# Patient Record
Sex: Female | Born: 2000 | Race: White | Hispanic: No | Marital: Single
Health system: Southern US, Community
[De-identification: ages and names within clinical notes are randomized; demographics above are authoritative.]

## PROBLEM LIST (undated history)

## (undated) DIAGNOSIS — F32A Depression, unspecified: Secondary | ICD-10-CM

## (undated) DIAGNOSIS — T7840XA Allergy, unspecified, initial encounter: Secondary | ICD-10-CM

## (undated) DIAGNOSIS — R040 Epistaxis: Principal | ICD-10-CM

## (undated) DIAGNOSIS — F329 Major depressive disorder, single episode, unspecified: Secondary | ICD-10-CM

## (undated) DIAGNOSIS — Z87440 Personal history of urinary (tract) infections: Secondary | ICD-10-CM

## (undated) HISTORY — DX: Depression, unspecified: F32.A

## (undated) HISTORY — DX: Major depressive disorder, single episode, unspecified: F32.9

---
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
## 2021-05-02 ENCOUNTER — Other Ambulatory Visit: Payer: Self-pay

## 2021-05-02 ENCOUNTER — Ambulatory Visit
Admission: RE | Admit: 2021-05-02 | Discharge: 2021-05-02 | Disposition: A | Discharge status: Home / Self Care | Payer: Medicaid Other | Source: Ambulatory Visit | Attending: Pediatrics | Admitting: Pediatrics

## 2021-05-02 DIAGNOSIS — R079 Chest pain, unspecified: Secondary | ICD-10-CM | POA: Diagnosis not present

## 2021-05-02 DIAGNOSIS — R002 Palpitations: Secondary | ICD-10-CM | POA: Diagnosis present

## 2021-12-16 ENCOUNTER — Other Ambulatory Visit: Payer: Self-pay

## 2021-12-16 ENCOUNTER — Encounter: Payer: Self-pay | Admitting: Podiatry

## 2021-12-16 ENCOUNTER — Ambulatory Visit (INDEPENDENT_AMBULATORY_CARE_PROVIDER_SITE_OTHER): Payer: Medicaid Other | Admitting: Podiatry

## 2021-12-16 DIAGNOSIS — L600 Ingrowing nail: Secondary | ICD-10-CM | POA: Diagnosis not present

## 2021-12-16 NOTE — Progress Notes (Signed)
Subjective:  Patient ID: Angela Mcmahon, female    DOB: 02/01/2001,  MRN: [id number]  Chief Complaint  Patient presents with   Ingrown Toenail    21 y.o. female presents with the above complaint.  Patient presents with right lateral border ingrown of the hallux.  Patient is a painful to touch painful to walk on.  She would like to have removed.  She has not seen anyone as prior to seeing me.  She denies any other acute complaints.  She has not seen anyone as prior to seeing me.  She would like to have removed.  Pain scale is 8 out of 10.  There is no infection present.   Review of Systems: Negative except as noted in the HPI. Denies N/V/F/Ch.  Past Medical History:  Diagnosis Date   Allergy    Depression    Epistaxis 05/2012   bilat. ant. epistaxis   Hx: UTI (urinary tract infection) 05/2012   finished antibiotic within the last week    Current Outpatient Medications:    cetirizine (ZYRTEC) 1 MG/ML syrup, Take 7.5 mg by mouth daily. PM, Disp: , Rfl:    Cholecalciferol (VITAMIN D) 2000 units tablet, Take 2,000 Units by mouth daily., Disp: , Rfl: 0   Dapsone 5 % topical gel, APPLY TO AFFECTED AREA EVERY DAY, Disp: , Rfl: 2   ibuprofen (ADVIL,MOTRIN) 800 MG tablet, Take 1 tablet (800 mg total) by mouth every 8 (eight) hours as needed., Disp: 60 tablet, Rfl: 1   JUNEL 1/20 1-20 MG-MCG tablet, Take 1 tablet by mouth daily., Disp: , Rfl: 3  Social History   Tobacco Use  Smoking Status Passive Smoke Exposure - Never Smoker  Smokeless Tobacco Never  Tobacco Comments   outside smokers at home    No Known Allergies Objective:  There were no vitals filed for this visit. There is no height or weight on file to calculate BMI. Constitutional Well developed. Well nourished.  Vascular Dorsalis pedis pulses palpable bilaterally. Posterior tibial pulses palpable bilaterally. Capillary refill normal to all digits.  No cyanosis or clubbing noted. Pedal hair growth normal.  Neurologic  Normal speech. Oriented to person, place, and time. Epicritic sensation to light touch grossly present bilaterally.  Dermatologic Painful ingrowing nail at lateral nail borders of the hallux nail right. No other open wounds. No skin lesions.  Orthopedic: Normal joint ROM without pain or crepitus bilaterally. No visible deformities. No bony tenderness.   Radiographs: None Assessment:   1. Ingrown toenail of right foot    Plan:  Patient was evaluated and treated and all questions answered.  Ingrown Nail, right -Patient elects to proceed with minor surgery to remove ingrown toenail removal today. Consent reviewed and signed by patient. -Ingrown nail excised. See procedure note. -Educated on post-procedure care including soaking. Written instructions provided and reviewed. -Patient to follow up in 2 weeks for nail check.  Procedure: Excision of Ingrown Toenail Location: Right 1st toe lateral nail borders. Anesthesia: Lidocaine 1% plain; 1.5 mL and Marcaine 0.5% plain; 1.5 mL, digital block. Skin Prep: Betadine. Dressing: Silvadene; telfa; dry, sterile, compression dressing. Technique: Following skin prep, the toe was exsanguinated and a tourniquet was secured at the base of the toe. The affected nail border was freed, split with a nail splitter, and excised. Chemical matrixectomy was then performed with phenol and irrigated out with alcohol. The tourniquet was then removed and sterile dressing applied. Disposition: Patient tolerated procedure well. Patient to return in 2 weeks for follow-up.  No follow-ups on file.

## 2021-12-25 ENCOUNTER — Encounter: Payer: Self-pay | Admitting: Podiatry

## 2023-02-01 DIAGNOSIS — R2232 Localized swelling, mass and lump, left upper limb: Secondary | ICD-10-CM

## 2023-02-01 DIAGNOSIS — M79642 Pain in left hand: Secondary | ICD-10-CM | POA: Insufficient documentation

## 2023-02-01 HISTORY — DX: Localized swelling, mass and lump, left upper limb: R22.32

## 2023-10-26 ENCOUNTER — Other Ambulatory Visit: Payer: Self-pay

## 2023-10-26 ENCOUNTER — Emergency Department (HOSPITAL_COMMUNITY)
Admission: EM | Admit: 2023-10-26 | Discharge: 2023-10-26 | Disposition: A | Discharge status: Home / Self Care | Payer: No Typology Code available for payment source | Attending: Emergency Medicine | Admitting: Emergency Medicine

## 2023-10-26 ENCOUNTER — Encounter (HOSPITAL_COMMUNITY): Payer: Self-pay

## 2023-10-26 ENCOUNTER — Emergency Department (HOSPITAL_COMMUNITY): Payer: No Typology Code available for payment source

## 2023-10-26 DIAGNOSIS — M542 Cervicalgia: Secondary | ICD-10-CM | POA: Insufficient documentation

## 2023-10-26 DIAGNOSIS — Y92410 Unspecified street and highway as the place of occurrence of the external cause: Secondary | ICD-10-CM | POA: Insufficient documentation

## 2023-10-26 DIAGNOSIS — R519 Headache, unspecified: Secondary | ICD-10-CM | POA: Diagnosis present

## 2023-10-26 DIAGNOSIS — R42 Dizziness and giddiness: Secondary | ICD-10-CM | POA: Diagnosis not present

## 2023-10-26 DIAGNOSIS — R109 Unspecified abdominal pain: Secondary | ICD-10-CM | POA: Insufficient documentation

## 2023-10-26 LAB — CBC
HCT: 41.9 % (ref 36.0–46.0)
Hemoglobin: 13.2 g/dL (ref 12.0–15.0)
MCH: 28.4 pg (ref 26.0–34.0)
MCHC: 31.5 g/dL (ref 30.0–36.0)
MCV: 90.3 fL (ref 80.0–100.0)
Platelets: 195 10*3/uL (ref 150–400)
RBC: 4.64 MIL/uL (ref 3.87–5.11)
RDW: 14.8 % (ref 11.5–15.5)
WBC: 6.3 10*3/uL (ref 4.0–10.5)
nRBC: 0 % (ref 0.0–0.2)

## 2023-10-26 LAB — COMPREHENSIVE METABOLIC PANEL
ALT: 11 U/L (ref 0–44)
AST: 16 U/L (ref 15–41)
Albumin: 4.5 g/dL (ref 3.5–5.0)
Alkaline Phosphatase: 36 U/L — ABNORMAL LOW (ref 38–126)
Anion gap: 8 (ref 5–15)
BUN: 15 mg/dL (ref 6–20)
CO2: 24 mmol/L (ref 22–32)
Calcium: 8.9 mg/dL (ref 8.9–10.3)
Chloride: 103 mmol/L (ref 98–111)
Creatinine, Ser: 0.58 mg/dL (ref 0.44–1.00)
GFR, Estimated: >60 mL/min (ref >60)
Glucose, Bld: 93 mg/dL (ref 70–99)
Potassium: 3.8 mmol/L (ref 3.5–5.1)
Sodium: 135 mmol/L (ref 135–145)
Total Bilirubin: 0.5 mg/dL (ref <1.2)
Total Protein: 8.1 g/dL (ref 6.5–8.1)

## 2023-10-26 LAB — HCG, QUANTITATIVE, PREGNANCY: hCG, Beta Chain, Quant, S: <1 m[IU]/mL (ref <5)

## 2023-10-26 MED ORDER — IOHEXOL 300 MG/ML  SOLN
100.0000 mL | Freq: Once | INTRAMUSCULAR | Status: AC | PRN
  Administered 2023-10-26: 100 mL via INTRAVENOUS

## 2023-10-26 MED ORDER — IBUPROFEN 800 MG PO TABS
800.0000 mg | ORAL_TABLET | Freq: Once | ORAL | Status: AC
  Administered 2023-10-26: 800 mg via ORAL
  Filled 2023-10-26: qty 1

## 2023-10-26 MED ORDER — CYCLOBENZAPRINE HCL 10 MG PO TABS: 10.0000 mg | ORAL_TABLET | Freq: Two times a day (BID) | ORAL | 0 refills | Status: DC | PRN

## 2023-10-26 MED ORDER — ACETAMINOPHEN 325 MG PO TABS
650.0000 mg | ORAL_TABLET | Freq: Once | ORAL | Status: AC
  Administered 2023-10-26: 650 mg via ORAL
  Filled 2023-10-26: qty 2
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
## 2024-03-21 ENCOUNTER — Other Ambulatory Visit (HOSPITAL_COMMUNITY)
Admission: RE | Admit: 2024-03-21 | Discharge: 2024-03-21 | Disposition: A | Discharge status: Home / Self Care | Source: Ambulatory Visit | Attending: Obstetrics & Gynecology | Admitting: Obstetrics & Gynecology

## 2024-03-21 ENCOUNTER — Ambulatory Visit (INDEPENDENT_AMBULATORY_CARE_PROVIDER_SITE_OTHER): Admitting: Obstetrics & Gynecology

## 2024-03-21 VITALS — BP 102/58 | HR 83 | Ht 65.0 in | Wt 145.0 lb

## 2024-03-21 DIAGNOSIS — Z01419 Encounter for gynecological examination (general) (routine) without abnormal findings: Secondary | ICD-10-CM | POA: Insufficient documentation

## 2024-03-21 DIAGNOSIS — Z124 Encounter for screening for malignant neoplasm of cervix: Secondary | ICD-10-CM

## 2024-03-21 DIAGNOSIS — Z113 Encounter for screening for infections with a predominantly sexual mode of transmission: Secondary | ICD-10-CM

## 2024-03-21 DIAGNOSIS — L680 Hirsutism: Secondary | ICD-10-CM

## 2024-03-21 NOTE — Progress Notes (Signed)
 GYNECOLOGY ANNUAL PHYSICAL EXAM PROGRESS NOTE  Subjective:    Angela Mcmahon is a 23 y.o. single monogamous [id number] who presents for an annual exam.  The patient is sexually active. The patient participates in regular exercise: yes. Has the patient ever been transfused or tattooed?: not asked. The patient reports that there is not domestic violence in her life.   The patient has the following complaints today: She started taking OCPs in a 3 month continuous fashion 12/24. Initially this went well until she bled for most of last month. She is currently not bleeding. She used Nexplanon for 3 years with no bleeding but with her second Nexplanon, she bled straight for 3 months and had it removed. That is when she started the Junel 1/20. She has not had a Pap smear yet. She started Gardasil but her mom didn't want her to finish the series, worried about cancers. Her mom had negative gene mutation testing. 4.   She has to shave her abdomen, nipples and occasionally a random hair on her face.  Menstrual History: Menarche age: 38 Patient's last menstrual period was 03/07/2024. Period Pattern: (!) Irregular Menstrual Flow: Heavy, Moderate, Light Menstrual Control: Tampon, Maxi pad Dysmenorrhea: (!) Moderate Dysmenorrhea Symptoms: Cramping       OB History  Gravida Para Term Preterm AB Living  0 0 0 0 0 0  SAB IAB Ectopic Multiple Live Births  0 0 0 0 0    Past Medical History:  Diagnosis Date   Allergy    Depression    Epistaxis 05/2012   bilat. ant. epistaxis   Hx: UTI (urinary tract infection) 05/2012   finished antibiotic within the last week    Past Surgical History:  Procedure Laterality Date   NASAL HEMORRHAGE CONTROL  06/06/2012   Procedure: EPISTAXIS CONTROL;  Surgeon: Lenton Rail, MD;  Location:  SURGERY CENTER;  Service: ENT;  Laterality: Bilateral;    Family History  Problem Relation Age of Onset   Hypertension Mother    Asthma Mother     Transient ischemic attack Mother    Colon polyps Mother    Hypertension Maternal Aunt    Asthma Maternal Uncle    Diabetes Paternal Aunt    Hypertension Maternal Grandmother    Lung cancer Maternal Grandmother    Brain cancer Maternal Grandmother    Diabetes Paternal Grandmother    Hypertension Paternal Grandmother    Nephrolithiasis Maternal Grandfather        hx. acute renal failure due to stones   Colon cancer Maternal Grandfather    Brain cancer Maternal Grandfather    Hypothyroidism Father    Bipolar disorder Sister    Depression Sister    ADD / ADHD Brother    Brain cancer Paternal Grandfather     Social History   Socioeconomic History   Marital status: Single    Spouse name: Not on file   Number of children: Not on file   Years of education: Not on file   Highest education level: Not on file  Occupational History   Not on file  Tobacco Use   Smoking status: Passive Smoke Exposure - Never Smoker   Smokeless tobacco: Never   Tobacco comments:    outside smokers at home  Substance and Sexual Activity   Alcohol use: No   Drug use: No   Sexual activity: Never    Birth control/protection: Pill  Other Topics Concern   Not on file  Social History Narrative  Not on file   Social Drivers of Health   Financial Resource Strain: Not on file  Food Insecurity: Not on file  Transportation Needs: Not on file  Physical Activity: Not on file  Stress: Not on file  Social Connections: Not on file  Intimate Partner Violence: Not on file    Current Outpatient Medications on File Prior to Visit  Medication Sig Dispense Refill   adapalene (DIFFERIN) 0.1 % cream SMARTSIG:sparingly Topical Every Night     cetirizine (ZYRTEC) 1 MG/ML syrup Take 7.5 mg by mouth daily. PM     glycopyrrolate (ROBINUL) 1 MG tablet Take 1 mg by mouth daily.     JUNEL 1/20 1-20 MG-MCG tablet Take 1 tablet by mouth daily.  3   sertraline (ZOLOFT) 100 MG tablet Take 150 mg by mouth daily.      Cholecalciferol (VITAMIN D) 2000 units tablet Take 2,000 Units by mouth daily. (Patient not taking: Reported on 03/21/2024)  0   cyclobenzaprine  (FLEXERIL ) 10 MG tablet Take 1 tablet (10 mg total) by mouth 2 (two) times daily as needed. (Patient not taking: Reported on 03/21/2024) 20 tablet 0   Dapsone 5 % topical gel APPLY TO AFFECTED AREA EVERY DAY (Patient not taking: Reported on 03/21/2024)  2   ibuprofen  (ADVIL ,MOTRIN ) 800 MG tablet Take 1 tablet (800 mg total) by mouth every 8 (eight) hours as needed. (Patient not taking: Reported on 03/21/2024) 60 tablet 1   No current facility-administered medications on file prior to visit.    No Known Allergies   Review of Systems Constitutional: negative for chills, fatigue, fevers and sweats Eyes: negative for irritation, redness and visual disturbance Ears, nose, mouth, throat, and face: negative for hearing loss, nasal congestion, snoring and tinnitus Respiratory: negative for asthma, cough, sputum Cardiovascular: negative for chest pain, dyspnea, exertional chest pressure/discomfort, irregular heart beat, palpitations and syncope Gastrointestinal: negative for abdominal pain, change in bowel habits, nausea and vomiting Genitourinary: negative for abnormal menstrual periods, genital lesions, sexual problems and vaginal discharge, dysuria and urinary incontinence Integument/breast: negative for breast lump, breast tenderness and nipple discharge Hematologic/lymphatic: negative for bleeding and easy bruising Musculoskeletal:negative for back pain and muscle weakness Neurological: negative for dizziness, headaches, vertigo and weakness Endocrine: negative for diabetic symptoms including polydipsia, polyuria and skin dryness Allergic/Immunologic: negative for hay fever and urticaria      Objective:  Blood pressure (!) 102/58, pulse 83, height 5\' 5"  (1.651 m), weight 145 lb (65.8 kg), last menstrual period 03/07/2024. Body mass index is 24.13  kg/m.    General Appearance:    Alert, cooperative, no distress, appears stated age   Marked hair on abdomen from umbilicus down  Head:    Normocephalic, without obvious abnormality, atraumatic  Eyes:    PERRL, conjunctiva/corneas clear, EOM's intact, both eyes  Ears:    Normal external ear canals, both ears  Nose:   Nares normal, septum midline, mucosa normal, no drainage or sinus tenderness  Throat:   Lips, mucosa, and tongue normal; teeth and gums normal  Neck:   Supple, symmetrical, trachea midline, no adenopathy; thyroid: no enlargement/tenderness/nodules; no carotid bruit or JVD  Back:     Symmetric, no curvature, ROM normal, no CVA tenderness  Lungs:     Clear to auscultation bilaterally, respirations unlabored  Chest Wall:    No tenderness or deformity   Heart:    Regular rate and rhythm, S1 and S2 normal, no murmur, rub or gallop  Breast Exam:    No tenderness,  masses, or nipple abnormality  Abdomen:     Soft, non-tender, bowel sounds active all four quadrants, no masses, no organomegaly.    Genitalia:    Pelvic:external genitalia normal, vagina without lesions, discharge, or tenderness, rectovaginal septum  normal. Cervix normal in appearance, no cervical motion tenderness, no adnexal masses or tenderness.  Uterus normal size, shape, mobile, regular contours, nontender.     Extremities:   Extremities normal, atraumatic, no cyanosis or edema  Pulses:   2+ and symmetric all extremities  Skin:   Skin color, texture, turgor normal, no rashes or lesions  Lymph nodes:   Cervical, supraclavicular, and axillary nodes normal  Neurologic:   CNII-XII intact, normal strength, sensation and reflexes throughout   .  Labs:  Lab Results  Component Value Date   WBC 6.3 10/26/2023   HGB 13.2 10/26/2023   HCT 41.9 10/26/2023   MCV 90.3 10/26/2023   PLT 195 10/26/2023    Lab Results  Component Value Date   CREATININE 0.58 10/26/2023   BUN 15 10/26/2023   NA 135 10/26/2023   K 3.8  10/26/2023   CL 103 10/26/2023   CO2 24 10/26/2023    Lab Results  Component Value Date   ALT 11 10/26/2023   AST 16 10/26/2023   ALKPHOS 36 (L) 10/26/2023   BILITOT 0.5 10/26/2023    No results found for: "TSH"   Assessment:   Well woman Cervical cancer screening STI screen hirsuitism   Plan:  Blood tests: sti screening + TSH + testosterone Breast self exam technique reviewed and patient encouraged to perform self-exam monthly. Contraception: continue OCPs. If the DUB restarts, then I will change her OCP brand. Discussed healthy lifestyle modifications. Pap smear ordered.  Follow up in 1 year for annual exam   Ana Balling, MD  OB/GYN

## 2024-03-22 LAB — TSH: TSH: 1.71 u[IU]/mL (ref 0.450–4.500)

## 2024-03-22 LAB — HEPATITIS C ANTIBODY: Hep C Virus Ab: NONREACTIVE

## 2024-03-22 LAB — TESTOSTERONE, FREE, TOTAL, SHBG
Sex Hormone Binding: 268 nmol/L — ABNORMAL HIGH (ref 24.6–122.0)
Testosterone, Free: 1.6 pg/mL (ref 0.0–4.2)
Testosterone: 25 ng/dL (ref 13–71)

## 2024-03-22 LAB — HEPATITIS B SURFACE ANTIGEN: Hepatitis B Surface Ag: NEGATIVE

## 2024-03-22 LAB — RPR: RPR Ser Ql: NONREACTIVE

## 2024-03-22 LAB — HIV ANTIBODY (ROUTINE TESTING W REFLEX): HIV Screen 4th Generation wRfx: NONREACTIVE

## 2024-03-24 LAB — CYTOLOGY - PAP
Chlamydia: NEGATIVE
Comment: NEGATIVE
Comment: NORMAL
Neisseria Gonorrhea: NEGATIVE

## 2024-03-27 ENCOUNTER — Encounter: Payer: Self-pay | Admitting: Obstetrics & Gynecology

## 2024-04-17 ENCOUNTER — Encounter: Payer: Self-pay | Admitting: Obstetrics & Gynecology

## 2024-05-16 ENCOUNTER — Ambulatory Visit: Admitting: Podiatry

## 2024-05-18 ENCOUNTER — Ambulatory Visit: Admitting: Podiatry

## 2024-05-18 DIAGNOSIS — L600 Ingrowing nail: Secondary | ICD-10-CM | POA: Diagnosis not present

## 2024-05-18 NOTE — Progress Notes (Signed)
 Subjective:  Patient ID: Angela Mcmahon, female    DOB: 02-25-2001,  MRN: [id number]  Chief Complaint  Patient presents with   Nail Problem    Left 5th toe possible ingrown nail     23 y.o. female presents with the above complaint.  Patient presents with left lateral border ingrown painful to touch is progressive gotten worse worse with ambulation worse with pressure patient would like to have it removed she has not seen anyone else prior to seeing me denies any other acute complaints.   Review of Systems: Negative except as noted in the HPI. Denies N/V/F/Ch.  Past Medical History:  Diagnosis Date   Allergy    Depression    Epistaxis 05/2012   bilat. ant. epistaxis   Hx: UTI (urinary tract infection) 05/2012   finished antibiotic within the last week    Current Outpatient Medications:    Cephalexin 500 MG tablet, Take 500 mg by mouth 2 (two) times daily., Disp: , Rfl:    gentamicin ointment (GARAMYCIN) 0.1 %, SMARTSIG:sparingly Topical Twice Daily, Disp: , Rfl:    mupirocin ointment (BACTROBAN) 2 %, Apply topically 2 (two) times daily., Disp: , Rfl:    norgestimate-ethinyl estradiol (ORTHO-CYCLEN) 0.25-35 MG-MCG tablet, Take by mouth., Disp: , Rfl:    spironolactone (ALDACTONE) 50 MG tablet, Take 50 mg by mouth 2 (two) times daily., Disp: , Rfl:    adapalene (DIFFERIN) 0.1 % cream, SMARTSIG:sparingly Topical Every Night, Disp: , Rfl:    cetirizine (ZYRTEC) 1 MG/ML syrup, Take 7.5 mg by mouth daily. PM, Disp: , Rfl:    Cholecalciferol (VITAMIN D) 2000 units tablet, Take 2,000 Units by mouth daily. (Patient not taking: Reported on 03/21/2024), Disp: , Rfl: 0   cyclobenzaprine  (FLEXERIL ) 10 MG tablet, Take 1 tablet (10 mg total) by mouth 2 (two) times daily as needed. (Patient not taking: Reported on 03/21/2024), Disp: 20 tablet, Rfl: 0   Dapsone 5 % topical gel, APPLY TO AFFECTED AREA EVERY DAY (Patient not taking: Reported on 03/21/2024), Disp: , Rfl: 2   glycopyrrolate (ROBINUL) 1  MG tablet, Take 1 mg by mouth daily., Disp: , Rfl:    ibuprofen  (ADVIL ,MOTRIN ) 800 MG tablet, Take 1 tablet (800 mg total) by mouth every 8 (eight) hours as needed. (Patient not taking: Reported on 03/21/2024), Disp: 60 tablet, Rfl: 1   JUNEL 1/20 1-20 MG-MCG tablet, Take 1 tablet by mouth daily., Disp: , Rfl: 3   naproxen (NAPROSYN) 500 MG tablet, TAKE 1 TAB BY MOUTH TWICE A DAY WITH FOOD FOR 7 DAYS, Disp: , Rfl:    sertraline (ZOLOFT) 100 MG tablet, Take 150 mg by mouth daily., Disp: , Rfl:    tretinoin (RETIN-A) 0.025 % cream, APPLY PEA SIZE AMOUNT TO DRY FACE AT NIGHT., Disp: , Rfl:   Social History   Tobacco Use  Smoking Status Passive Smoke Exposure - Never Smoker  Smokeless Tobacco Never  Tobacco Comments   outside smokers at home    No Known Allergies Objective:  There were no vitals filed for this visit. There is no height or weight on file to calculate BMI. Constitutional Well developed. Well nourished.  Vascular Dorsalis pedis pulses palpable bilaterally. Posterior tibial pulses palpable bilaterally. Capillary refill normal to all digits.  No cyanosis or clubbing noted. Pedal hair growth normal.  Neurologic Normal speech. Oriented to person, place, and time. Epicritic sensation to light touch grossly present bilaterally.  Dermatologic Painful ingrowing nail at lateral nail borders of the left fifth digit nail left.  No other open wounds. No skin lesions.  Orthopedic: Normal joint ROM without pain or crepitus bilaterally. No visible deformities. No bony tenderness.   Radiographs: None Assessment:  No diagnosis found. Plan:  Patient was evaluated and treated and all questions answered.  Ingrown Nail, left left fifth digit -Patient elects to proceed with minor surgery to remove ingrown toenail removal today. Consent reviewed and signed by patient. -Ingrown nail excised. See procedure note. -Educated on post-procedure care including soaking. Written instructions  provided and reviewed. -Patient to follow up in 2 weeks for nail check.  Procedure: Excision of Ingrown Toenail Location: Left fifth digit lateral nail borders. Anesthesia: Lidocaine 1% plain; 1.5 mL and Marcaine 0.5% plain; 1.5 mL, digital block. Skin Prep: Betadine. Dressing: Silvadene; telfa; dry, sterile, compression dressing. Technique: Following skin prep, the toe was exsanguinated and a tourniquet was secured at the base of the toe. The affected nail border was freed, split with a nail splitter, and excised. Chemical matrixectomy was then performed with phenol and irrigated out with alcohol. The tourniquet was then removed and sterile dressing applied. Disposition: Patient tolerated procedure well. Patient to return in 2 weeks for follow-up.   No follow-ups on file.

## 2024-11-09 ENCOUNTER — Telehealth: Payer: Self-pay | Admitting: Pediatrics

## 2024-11-09 NOTE — Telephone Encounter (Signed)
 Mom Leita had a visit with Mallie states she discussed her daughter being seen as a new patient please advise if we may add (scheduled for 12/05/24) If this is not okay we can change.  Blocked a 40 min appt to allow enough time.

## 2024-11-10 NOTE — Telephone Encounter (Signed)
This is fine, thank you.

## 2024-11-15 ENCOUNTER — Telehealth: Payer: Self-pay | Admitting: Pediatrics

## 2024-11-15 NOTE — Telephone Encounter (Signed)
 Records from Pediatric office arrived by mail.  Placed in providers box for review.

## 2024-12-05 ENCOUNTER — Ambulatory Visit: Payer: Self-pay | Admitting: Primary Care

## 2024-12-05 ENCOUNTER — Encounter: Payer: Self-pay | Admitting: Primary Care

## 2024-12-05 ENCOUNTER — Ambulatory Visit: Admitting: Primary Care

## 2024-12-05 VITALS — BP 114/72 | HR 100 | Temp 98.1°F | Ht 64.5 in | Wt 147.1 lb

## 2024-12-05 DIAGNOSIS — E559 Vitamin D deficiency, unspecified: Secondary | ICD-10-CM

## 2024-12-05 DIAGNOSIS — R7989 Other specified abnormal findings of blood chemistry: Secondary | ICD-10-CM

## 2024-12-05 DIAGNOSIS — R61 Generalized hyperhidrosis: Secondary | ICD-10-CM | POA: Insufficient documentation

## 2024-12-05 DIAGNOSIS — F419 Anxiety disorder, unspecified: Secondary | ICD-10-CM | POA: Diagnosis not present

## 2024-12-05 DIAGNOSIS — R5382 Chronic fatigue, unspecified: Secondary | ICD-10-CM | POA: Insufficient documentation

## 2024-12-05 DIAGNOSIS — F32A Depression, unspecified: Secondary | ICD-10-CM | POA: Insufficient documentation

## 2024-12-05 DIAGNOSIS — R110 Nausea: Secondary | ICD-10-CM | POA: Insufficient documentation

## 2024-12-05 DIAGNOSIS — D509 Iron deficiency anemia, unspecified: Secondary | ICD-10-CM

## 2024-12-05 LAB — VITAMIN B12: Vitamin B-12: 216 pg/mL (ref 211–911)

## 2024-12-05 LAB — COMPREHENSIVE METABOLIC PANEL WITH GFR
ALT: 7 U/L (ref 3–35)
AST: 14 U/L (ref 5–37)
Albumin: 4.6 g/dL (ref 3.5–5.2)
Alkaline Phosphatase: 53 U/L (ref 39–117)
BUN: 8 mg/dL (ref 6–23)
CO2: 26 meq/L (ref 19–32)
Calcium: 9.6 mg/dL (ref 8.4–10.5)
Chloride: 104 meq/L (ref 96–112)
Creatinine, Ser: 0.66 mg/dL (ref 0.40–1.20)
GFR: 124.69 mL/min
Glucose, Bld: 72 mg/dL (ref 70–99)
Potassium: 3.7 meq/L (ref 3.5–5.1)
Sodium: 137 meq/L (ref 135–145)
Total Bilirubin: 0.7 mg/dL (ref 0.2–1.2)
Total Protein: 7.5 g/dL (ref 6.0–8.3)

## 2024-12-05 LAB — CBC
HCT: 40.6 % (ref 36.0–46.0)
Hemoglobin: 13.3 g/dL (ref 12.0–15.0)
MCHC: 32.6 g/dL (ref 30.0–36.0)
MCV: 84.2 fl (ref 78.0–100.0)
Platelets: 218 10*3/uL (ref 150.0–400.0)
RBC: 4.83 Mil/uL (ref 3.87–5.11)
RDW: 15.6 % — ABNORMAL HIGH (ref 11.5–15.5)
WBC: 7 10*3/uL (ref 4.0–10.5)

## 2024-12-05 LAB — VITAMIN D 25 HYDROXY (VIT D DEFICIENCY, FRACTURES): VITD: 12.4 ng/mL — ABNORMAL LOW (ref 30.00–100.00)

## 2024-12-05 LAB — IBC + FERRITIN
Ferritin: 5.4 ng/mL — ABNORMAL LOW (ref 10.0–291.0)
Iron: 112 ug/dL (ref 42–145)
Saturation Ratios: 29.7 % (ref 20.0–50.0)
TIBC: 376.6 ug/dL (ref 250.0–450.0)
Transferrin: 269 mg/dL (ref 212.0–360.0)

## 2024-12-05 LAB — TSH: TSH: 0.92 u[IU]/mL (ref 0.35–5.50)

## 2024-12-05 LAB — HCG, QUANTITATIVE, PREGNANCY: Quantitative HCG: <0.6 m[IU]/mL

## 2024-12-05 NOTE — Progress Notes (Addendum)
 "  Subjective:    Patient ID: Angela Mcmahon, female    DOB: 2001/09/13, 24 y.o.   MRN: [id number]  Angela Mcmahon is a very pleasant 24 y.o. female who presents today who presents today to establish care and discuss the problems mentioned below. Will obtain/review records.  1) Anxiety and Depression: Diagnosed years ago. Currently managed on sertraline 100 mg daily. She denies SI/HI, GI upset. Feels well managed. Recently reduced down to the 100 mg dose.   2) Excessive Sweating: Chronic to the hands, axilla, and feet. She is managed on glycopyrrolate 1 mg daily. Following with dermatology.   3) Birth Control: Following with gynecology, managed on OCPs for acne and menorrhagia. Last office visit was in May 2025, last pap smear was in May 2025 with [facility]. Menstrual cycles are overall tolerable but she does continue to bleed despite three months of consistent use.   4) Fatigue: Chronic since Fall of 2025, just prior to going to third shift at work. She wakes up feeling tired despite 8-14 hours of sleep. She works from 10 pm to 6 am four days weekly. She feels tired at work. She does have a history menorrhagia. She has difficulty falling asleep, will lay awake sometimes for hours.  TSH in May 2025 was normal.  She does have a family history of hypothyroidism in her father.  5) Nausea/Breast Tenderness: Over the last week she has noticed breast tenderness with intermittent nausea.  She admits to inconsistency with her birth control pills a few weeks ago.  She is sexually active.  She has not taken a pregnancy test, would like this included today.  She cannot urinate now. LMP was 0/1/16/2026.   Review of Systems  Respiratory:  Negative for shortness of breath.   Cardiovascular:  Negative for chest pain.  Gastrointestinal:  Positive for nausea. Negative for constipation and diarrhea.  Genitourinary:  Negative for menstrual problem.       Breast tenderness  Neurological:  Negative for dizziness  and headaches.  Psychiatric/Behavioral:  The patient is not nervous/anxious.          Past Medical History:  Diagnosis Date   Allergy    Depression    Epistaxis 05/2012   bilat. ant. epistaxis   Hx: UTI (urinary tract infection) 05/2012   finished antibiotic within the last week   Localized swelling on left hand 02/01/2023    Social History   Socioeconomic History   Marital status: Single    Spouse name: Not on file   Number of children: Not on file   Years of education: Not on file   Highest education level: Not on file  Occupational History   Not on file  Tobacco Use   Smoking status: Passive Smoke Exposure - Never Smoker   Smokeless tobacco: Never   Tobacco comments:    outside smokers at home  Substance and Sexual Activity   Alcohol use: No   Drug use: No   Sexual activity: Never    Birth control/protection: Pill  Other Topics Concern   Not on file  Social History Narrative   Not on file   Social Drivers of Health   Tobacco Use: Medium Risk (12/05/2024)   Patient History    Smoking Tobacco Use: Passive Smoke Exposure - Never Smoker    Smokeless Tobacco Use: Never    Passive Exposure: Yes  Financial Resource Strain: Not on file  Food Insecurity: Not on file  Transportation Needs: Not on file  Physical Activity: Not  on file  Stress: Not on file  Social Connections: Not on file  Intimate Partner Violence: Not on file  Depression (PHQ2-9): Not on file  Alcohol Screen: Not on file  Housing: Not on file  Utilities: Not on file  Health Literacy: Not on file    Past Surgical History:  Procedure Laterality Date   NASAL HEMORRHAGE CONTROL  06/06/2012   Procedure: EPISTAXIS CONTROL;  Surgeon: Marlyce Finer, MD;  Location: [facility];  Service: ENT;  Laterality: Bilateral;    Family History  Problem Relation Age of Onset   Hypertension Mother    Asthma Mother    Transient ischemic attack Mother    Colon polyps Mother    Hypothyroidism  Father    Bipolar disorder Sister        Half sister   Depression Sister    ADD / ADHD Brother    Hypertension Maternal Grandmother    Lung cancer Maternal Grandmother    Brain cancer Maternal Grandmother    Nephrolithiasis Maternal Grandfather        hx. acute renal failure due to stones   Colon cancer Maternal Grandfather    Brain cancer Maternal Grandfather    Diabetes Paternal Grandmother    Hypertension Paternal Grandmother    Brain cancer Paternal Grandfather    Hypertension Maternal Aunt    Asthma Maternal Uncle    Diabetes Paternal Aunt     Allergies[1]  Medications Ordered Prior to Encounter[2]  BP 114/72   Pulse 100   Temp 98.1 F (36.7 C) (Oral)   Ht 5' 4.5 (1.638 m)   Wt 147 lb 2 oz (66.7 kg)   LMP 11/17/2024   SpO2 98%   BMI 24.86 kg/m  Objective:   Physical Exam Cardiovascular:     Rate and Rhythm: Normal rate and regular rhythm.  Pulmonary:     Effort: Pulmonary effort is normal.     Breath sounds: Normal breath sounds.  Abdominal:     General: Bowel sounds are normal.     Palpations: Abdomen is soft.     Tenderness: There is no abdominal tenderness.  Musculoskeletal:     Cervical back: Neck supple.  Skin:    General: Skin is warm and dry.  Neurological:     Mental Status: She is alert and oriented to person, place, and time.  Psychiatric:        Mood and Affect: Mood normal.     Physical Exam        Assessment & Plan:  Chronic fatigue Assessment & Plan: Differentials include iron deficiency anemia, hypothyroidism, night shift work schedule, sleeping too much.  Checking labs today including CBC, iron studies, CMP, TSH, vitamin B12 and D. If all labs remarkable then recommend she consider more daytime hours at work.  Orders: -     CBC -     IBC + Ferritin -     Comprehensive metabolic panel with GFR -     TSH -     Vitamin B12 -     VITAMIN D  25 Hydroxy (Vit-D Deficiency, Fractures)  Anxiety and depression Assessment &  Plan: Controlled  Continue sertraline 100 mg daily She will update when she needs refills   Excessive sweating Assessment & Plan: Controlled  Continue glycopyrrolate 1 mg daily   Nausea Assessment & Plan: Symptoms suggestive of early pregnancy. Recommended urine pregnancy test, however she is unable to urinate.  She is requesting blood draw.  Orders for hCG quantitative pending  Orders: -     hCG, quantitative, pregnancy    Assessment and Plan Assessment & Plan         Comer MARLA Gaskins, NP       [1] No Known Allergies [2]  Current Outpatient Medications on File Prior to Visit  Medication Sig Dispense Refill   glycopyrrolate (ROBINUL) 1 MG tablet Take 1 mg by mouth daily.     ibuprofen  (ADVIL ,MOTRIN ) 800 MG tablet Take 1 tablet (800 mg total) by mouth every 8 (eight) hours as needed. 60 tablet 1   norgestimate-ethinyl estradiol (ORTHO-CYCLEN) 0.25-35 MG-MCG tablet Take by mouth.     sertraline (ZOLOFT) 100 MG tablet Take 100 mg by mouth daily.     No current facility-administered medications on file prior to visit.   "

## 2024-12-05 NOTE — Patient Instructions (Signed)
 Stop by the lab prior to leaving today. I will notify you of your results once received.   It was a pleasure to meet you today! Please don't hesitate to contact me with any questions. Welcome to [facility]!

## 2024-12-05 NOTE — Assessment & Plan Note (Signed)
 Differentials include iron deficiency anemia, hypothyroidism, night shift work schedule, sleeping too much.  Checking labs today including CBC, iron studies, CMP, TSH, vitamin B12 and D. If all labs remarkable then recommend she consider more daytime hours at work.

## 2024-12-05 NOTE — Assessment & Plan Note (Signed)
 Controlled  Continue glycopyrrolate 1 mg daily

## 2024-12-05 NOTE — Addendum Note (Signed)
 Addended by: Renato Spellman K on: 12/05/2024 12:19 PM   Modules accepted: Orders

## 2024-12-06 MED ORDER — VITAMIN D (ERGOCALCIFEROL) 1.25 MG (50000 UNIT) PO CAPS: ORAL_CAPSULE | ORAL | 0 refills | Status: AC
